# Patient Record
Sex: Female | Born: 1955 | Race: Black or African American | Hispanic: No | Marital: Single | Smoking: Never smoker
Health system: Southern US, Community
[De-identification: ages and names within clinical notes are randomized; demographics above are authoritative.]

## PROBLEM LIST (undated history)

## (undated) DIAGNOSIS — I10 Essential (primary) hypertension: Secondary | ICD-10-CM

---

## 2003-12-13 ENCOUNTER — Ambulatory Visit (HOSPITAL_COMMUNITY): Admission: RE | Admit: 2003-12-13 | Discharge: 2003-12-13 | Payer: Self-pay | Admitting: Anesthesiology

## 2008-08-26 ENCOUNTER — Encounter: Admission: RE | Admit: 2008-08-26 | Discharge: 2008-08-26 | Payer: Self-pay | Admitting: Pulmonary Disease

## 2008-09-14 ENCOUNTER — Encounter: Payer: Self-pay | Admitting: Internal Medicine

## 2010-04-23 ENCOUNTER — Encounter: Payer: Self-pay | Admitting: Internal Medicine

## 2011-03-18 ENCOUNTER — Emergency Department (INDEPENDENT_AMBULATORY_CARE_PROVIDER_SITE_OTHER)
Admission: EM | Admit: 2011-03-18 | Discharge: 2011-03-18 | Disposition: A | Discharge status: Home / Self Care | Payer: Self-pay | Source: Home / Self Care | Attending: Emergency Medicine | Admitting: Emergency Medicine

## 2011-03-18 ENCOUNTER — Encounter: Payer: Self-pay | Admitting: *Deleted

## 2011-03-18 DIAGNOSIS — J329 Chronic sinusitis, unspecified: Secondary | ICD-10-CM

## 2011-03-18 HISTORY — DX: Essential (primary) hypertension: I10

## 2011-03-18 MED ORDER — AMOXICILLIN 500 MG PO CAPS: 500.0000 mg | ORAL_CAPSULE | Freq: Three times a day (TID) | ORAL | Status: AC

## 2011-03-18 NOTE — ED Notes (Signed)
Pt with congestion and cough x one week onset of left earache and left sided facial tenderness

## 2011-03-18 NOTE — ED Provider Notes (Signed)
History     CSN: [id number] Arrival date & time: 03/18/2011 12:53 PM   First MD Initiated Contact with Patient 03/18/11 1314      Chief Complaint  Patient presents with  . Otalgia  . Cough  . Nasal Congestion    (Consider location/radiation/quality/duration/timing/severity/associated sxs/prior treatment) Patient is a 55 y.o. female presenting with ear pain and cough. The history is provided by the patient. No language interpreter was used.  Otalgia This is a new problem. The current episode started more than 2 days ago. There is pain in the left ear. The problem occurs constantly. The problem has been gradually worsening. There has been no fever. The pain is at a severity of 5/10. The pain is moderate. Associated symptoms include rhinorrhea, sore throat and cough. Her past medical history does not include chronic ear infection.  Cough Associated symptoms include ear pain, rhinorrhea and sore throat.    Past Medical History  Diagnosis Date  . Hypertension     History reviewed. No pertinent past surgical history.  History reviewed. No pertinent family history.  History  Substance Use Topics  . Smoking status: Never Smoker   . Smokeless tobacco: Not on file  . Alcohol Use: No    OB History    Grav Para Term Preterm Abortions TAB SAB Ect Mult Living                  Review of Systems  HENT: Positive for ear pain, sore throat and rhinorrhea.   Respiratory: Positive for cough.   All other systems reviewed and are negative.    Allergies  Review of patient's allergies indicates no known allergies.  Home Medications   Current Outpatient Rx  Name Route Sig Dispense Refill  . OLMESARTAN MEDOXOMIL 20 MG PO TABS Oral Take by mouth daily.        BP 132/78  Pulse 94  Temp(Src) 98.2 F (36.8 C) (Oral)  Resp 19  SpO2 100%  Physical Exam  Nursing note and vitals reviewed. Constitutional: She is oriented to person, place, and time. She appears well-developed and  well-nourished.  HENT:  Head: Normocephalic and atraumatic.  Right Ear: External ear normal.  Left Ear: External ear normal.  Nose: Nose normal.  Mouth/Throat: Oropharynx is clear and moist.  Eyes: Conjunctivae and EOM are normal. Pupils are equal, round, and reactive to light.  Neck: Normal range of motion. Neck supple.  Cardiovascular: Normal rate and normal heart sounds.   Pulmonary/Chest: Effort normal.  Abdominal: Soft.  Musculoskeletal: Normal range of motion.  Neurological: She is alert and oriented to person, place, and time.  Skin: Skin is warm.  Psychiatric: She has a normal mood and affect.    ED Course  Procedures (including critical care time)  Labs Reviewed - No data to display No results found.   No diagnosis found.    MDM   I will treat for sinusitis with amoxicillian.     Langston Masker, [place] 03/18/11 1342  Langston Masker, [place] 03/18/11 [phone]

## 2011-03-20 NOTE — ED Provider Notes (Signed)
Medical screening examination/treatment/procedure(s) were performed by non-physician practitioner and as supervising physician I was immediately available for consultation/collaboration.  Luiz Blare MD   Luiz Blare, MD 03/20/11 [phone]

## 2011-10-17 ENCOUNTER — Emergency Department (HOSPITAL_COMMUNITY)
Admission: EM | Admit: 2011-10-17 | Discharge: 2011-10-17 | Disposition: A | Discharge status: Home / Self Care | Payer: Worker's Compensation | Attending: Emergency Medicine | Admitting: Emergency Medicine

## 2011-10-17 ENCOUNTER — Encounter (HOSPITAL_COMMUNITY): Payer: Self-pay | Admitting: *Deleted

## 2011-10-17 DIAGNOSIS — S81009A Unspecified open wound, unspecified knee, initial encounter: Secondary | ICD-10-CM | POA: Insufficient documentation

## 2011-10-17 DIAGNOSIS — W268XXA Contact with other sharp object(s), not elsewhere classified, initial encounter: Secondary | ICD-10-CM | POA: Insufficient documentation

## 2011-10-17 DIAGNOSIS — I10 Essential (primary) hypertension: Secondary | ICD-10-CM | POA: Insufficient documentation

## 2011-10-17 DIAGNOSIS — S81811A Laceration without foreign body, right lower leg, initial encounter: Secondary | ICD-10-CM

## 2011-10-17 DIAGNOSIS — Z23 Encounter for immunization: Secondary | ICD-10-CM | POA: Insufficient documentation

## 2011-10-17 MED ORDER — TETANUS-DIPHTH-ACELL PERTUSSIS 5-2.5-18.5 LF-MCG/0.5 IM SUSP
0.5000 mL | Freq: Once | INTRAMUSCULAR | Status: AC
  Administered 2011-10-17: 0.5 mL via INTRAMUSCULAR
  Filled 2011-10-17: qty 0.5

## 2011-10-17 NOTE — ED Notes (Signed)
Throwing out trash and cut lateral rt leg with something in bag

## 2011-10-17 NOTE — ED Provider Notes (Signed)
History     CSN: [id number]  Arrival date & time 10/17/11  1417   First MD Initiated Contact with Patient 10/17/11 1440      Chief Complaint  Patient presents with  . Laceration    (Consider location/radiation/quality/duration/timing/severity/associated sxs/prior treatment) Patient is a 56 y.o. female presenting with skin laceration. The history is provided by the patient.  Laceration    Patient presents to emergency department complaining of laceration to right lower lateral leg just prior to arrival. Patient states that she was throwing  out the trash and when she swung the trash bag passed her leg something sharp within the trash bag cut her right lower leg. She denies additional injury. Patient is unsure of her last tetanus up date. She was concerned about the initial bleeding stating that she has little to no pain associated with laceration. She did nothing for pain prior to arrival. She is ambulating without difficulty. Denies FB within leg.   Past Medical History  Diagnosis Date  . Hypertension     History reviewed. No pertinent past surgical history.  No family history on file.  History  Substance Use Topics  . Smoking status: Never Smoker   . Smokeless tobacco: Not on file  . Alcohol Use: No    OB History    Grav Para Term Preterm Abortions TAB SAB Ect Mult Living                  Review of Systems  Skin: Positive for wound. Negative for color change.  Neurological: Negative for weakness and numbness.    Allergies  Review of patient's allergies indicates no known allergies.  Home Medications   Current Outpatient Rx  Name Route Sig Dispense Refill  . OLMESARTAN MEDOXOMIL 20 MG PO TABS Oral Take 20 mg by mouth daily.       BP 117/79  Temp 98.4 F (36.9 C)  Resp 16  SpO2 99%  Physical Exam  Constitutional: She is oriented to person, place, and time. She appears well-developed and well-nourished. No distress.  HENT:  Head: Normocephalic and  atraumatic.  Eyes: Conjunctivae are normal.  Cardiovascular: Normal rate and regular rhythm.   Pulmonary/Chest: Effort normal.  Musculoskeletal: She exhibits tenderness.       Right ankle: She exhibits swelling. tenderness.       4 mm linear laceration of right lower lateral leg. Hemastatic. No FB seen or palpated. LE neuro vasc intact with FROM with 5/5 strength.   Neurological: She is alert and oriented to person, place, and time.       Normal sensation of entire foot.   Skin: Skin is warm and dry. No rash noted. She is not diaphoretic. No erythema. No pallor.       4mm linear superficial lac of RLE, lateral.   Psychiatric: She has a normal mood and affect. Her behavior is normal.    ED Course  Procedures (including critical care time)  IM tetanus  Wound cleaned with syringe irrigation with sterile water and betadine.   4mm linear laceration of right lower lateral leg edges approximated well with steri strip.   Labs Reviewed - No data to display No results found.   1. Laceration of right lower extremity       MDM  4mm superficial laceration of RLU without FB seen or palpated. Hemostatic. LE neurovasc intact. Wound care and follow up instructions given. Patient voices understanding.         [place], [place] 10/17/11  1504 

## 2011-10-18 NOTE — ED Provider Notes (Signed)
Medical screening examination/treatment/procedure(s) were performed by non-physician practitioner and as supervising physician I was immediately available for consultation/collaboration.   Brettany Sydney R Daryl Quiros, MD 10/18/11 0749 

## 2013-07-18 ENCOUNTER — Emergency Department (HOSPITAL_COMMUNITY): Payer: 59

## 2013-07-18 ENCOUNTER — Encounter (HOSPITAL_COMMUNITY): Payer: Self-pay | Admitting: Emergency Medicine

## 2013-07-18 ENCOUNTER — Emergency Department (HOSPITAL_COMMUNITY)
Admission: EM | Admit: 2013-07-18 | Discharge: 2013-07-18 | Disposition: A | Discharge status: Home / Self Care | Payer: 59 | Attending: Emergency Medicine | Admitting: Emergency Medicine

## 2013-07-18 DIAGNOSIS — R079 Chest pain, unspecified: Secondary | ICD-10-CM | POA: Insufficient documentation

## 2013-07-18 DIAGNOSIS — I10 Essential (primary) hypertension: Secondary | ICD-10-CM | POA: Insufficient documentation

## 2013-07-18 DIAGNOSIS — Z79899 Other long term (current) drug therapy: Secondary | ICD-10-CM | POA: Insufficient documentation

## 2013-07-18 DIAGNOSIS — J069 Acute upper respiratory infection, unspecified: Secondary | ICD-10-CM

## 2013-07-18 MED ORDER — GUAIFENESIN 100 MG/5ML PO LIQD: 100.0000 mg | ORAL | Status: DC | PRN

## 2013-07-18 MED ORDER — ACETAMINOPHEN 500 MG PO TABS
1000.0000 mg | ORAL_TABLET | Freq: Once | ORAL | Status: AC
  Administered 2013-07-18: 1000 mg via ORAL
  Filled 2013-07-18: qty 2

## 2013-07-18 MED ORDER — IBUPROFEN 600 MG PO TABS: 600.0000 mg | ORAL_TABLET | Freq: Four times a day (QID) | ORAL | Status: DC | PRN

## 2013-07-18 NOTE — ED Notes (Signed)
Brenda FinnerErin Bartlett at bedside

## 2013-07-18 NOTE — Discharge Instructions (Signed)
Take 400-600 mg Ibuprofen (Motrin) every 6-8 hours for fever and pain  Alternate with Tylenol  Take 500 mg Tylenol every 4-6 hours as needed for fever and pain  Follow-up with your primary care provider next week for recheck of symptoms if not improving.  Be sure to drink plenty of fluids and rest, at least 8hrs of sleep a night, preferably more while you are sick. Return to the ED if you cannot keep down fluids/signs of dehydration, fever not reducing with Tylenol, difficulty breathing/wheezing, stiff neck, worsening condition, or other concerns (see below)    Cough, Adult  A cough is a reflex. It helps you clear your throat and airways. A cough can help heal your body. A cough can last 2 or 3 weeks (acute) or may last more than 8 weeks (chronic). Some common causes of a cough can include an infection, allergy, or a cold. HOME CARE  Only take medicine as told by your doctor.  If given, take your medicines (antibiotics) as told. Finish them even if you start to feel better.  Use a cold steam vaporizer or humidier in your home. This can help loosen thick spit (secretions).  Sleep so you are almost sitting up (semi-upright). Use pillows to do this. This helps reduce coughing.  Rest as needed.  Stop smoking if you smoke. GET HELP RIGHT AWAY IF:  You have yellowish-white fluid (pus) in your thick spit.  Your cough gets worse.  Your medicine does not reduce coughing, and you are losing sleep.  You cough up blood.  You have trouble breathing.  Your pain gets worse and medicine does not help.  You have a fever. MAKE SURE YOU:   Understand these instructions.  Will watch your condition.  Will get help right away if you are not doing well or get worse. Document Released: 11/30/2010 Document Revised: 06/11/2011 Document Reviewed: 11/30/2010 [facility] Patient Information 2014 [place], [place].  Cool Mist Vaporizers Vaporizers may help relieve the symptoms of a cough and cold. They  add moisture to the air, which helps mucus to become thinner and less sticky. This makes it easier to breathe and cough up secretions. Cool mist vaporizers do not cause serious burns like hot mist vaporizers ("steamers, humidifiers"). Vaporizers have not been proved to show they help with colds. You should not use a vaporizer if you are allergic to mold.  HOME CARE INSTRUCTIONS  Follow the package instructions for the vaporizer.  Do not use anything other than distilled water in the vaporizer.  Do not run the vaporizer all of the time. This can cause mold or bacteria to grow in the vaporizer.  Clean the vaporizer after each time it is used.  Clean and dry the vaporizer well before storing it.  Stop using the vaporizer if worsening respiratory symptoms develop. Document Released: 12/15/2003 Document Revised: 11/19/2012 Document Reviewed: 08/06/2012 [facility] Patient Information 2014 [place], [place].

## 2013-07-18 NOTE — ED Provider Notes (Signed)
10:50 AM Complains of cough sore throat and headache onset 2 days ago. Denies shortness of breath denies vomiting. On exam no distress nontoxic HEENT exam positive nasal congestion oropharynx is normal lungs clear auscultation. Patient presently feels well and ready to go home. Suspect influenza-like illness  Doug SouSam Nailyn Dearinger, MD 07/18/13 1058

## 2013-07-18 NOTE — ED Provider Notes (Signed)
CSN: [id number]     Arrival date & time 07/18/13  0847 History   First MD Initiated Contact with Patient 07/18/13 [phone]     Chief Complaint  Patient presents with  . Cough  . Sore Throat     (Consider location/radiation/quality/duration/timing/severity/associated sxs/prior Treatment) Patient is a 58 y.o. female presenting with cough and pharyngitis. The history is provided by the patient. No language interpreter was used.  Cough Cough characteristics:  Productive Sputum characteristics:  White Severity:  Moderate Onset quality:  Sudden Duration:  1 day Timing:  Intermittent Progression:  Unchanged Chronicity:  New Smoker: no   Context: upper respiratory infection   Context: not sick contacts   Relieved by:  Nothing Worsened by:  Nothing tried Ineffective treatments:  Cough suppressants Associated symptoms: chest pain, fever and sore throat   Associated symptoms: no chills, no diaphoresis and no shortness of breath   Sore Throat Associated symptoms include chest pain, congestion, coughing, a fever and a sore throat. Pertinent negatives include no abdominal pain, chills, diaphoresis, fatigue, nausea or vomiting.   Pt is a 58yo female with hx of HTN presenting to ED c/o productive cough with white secretions, associated with sore throat and chest pain only with cough, onset yesterday.  Pt also reports subjective fever.  States she took OTC cough medicine w/o relief. Denies abdominal pain, n/v/d. Denies hx of asthma, COPD, or CAD. Denies known sick contacts or recent travel.  Past Medical History  Diagnosis Date  . Hypertension    History reviewed. No pertinent past surgical history. No family history on file. History  Substance Use Topics  . Smoking status: Never Smoker   . Smokeless tobacco: Not on file  . Alcohol Use: No   OB History   Grav Para Term Preterm Abortions TAB SAB Ect Mult Living                 Review of Systems  Constitutional: Positive for fever.  Negative for chills, diaphoresis, appetite change and fatigue.  HENT: Positive for congestion and sore throat. Negative for sneezing, trouble swallowing and voice change.   Respiratory: Positive for cough. Negative for shortness of breath.   Cardiovascular: Positive for chest pain. Negative for palpitations and leg swelling.  Gastrointestinal: Negative for nausea, vomiting, abdominal pain and diarrhea.  All other systems reviewed and are negative.     Allergies  Review of patient's allergies indicates no known allergies.  Home Medications   Prior to Admission medications   Medication Sig Start Date End Date Taking? Authorizing Provider  olmesartan (BENICAR) 20 MG tablet Take 20 mg by mouth daily.     Historical Provider, MD   BP 114/61  Pulse 79  Temp(Src) 100.4 F (38 C) (Oral)  Resp 22  Wt 231 lb 7 oz (104.979 kg)  SpO2 97% Physical Exam  Nursing note and vitals reviewed. Constitutional: She appears well-developed and well-nourished. No distress.  HENT:  Head: Normocephalic and atraumatic.  Right Ear: Hearing, tympanic membrane, external ear and ear canal normal.  Left Ear: Hearing, tympanic membrane, external ear and ear canal normal.  Nose: Mucosal edema present.  Mouth/Throat: Uvula is midline and mucous membranes are normal. Posterior oropharyngeal erythema present. No oropharyngeal exudate, posterior oropharyngeal edema or tonsillar abscesses.  Eyes: Conjunctivae are normal. No scleral icterus.  Neck: Normal range of motion. Neck supple.  Cardiovascular: Normal rate, regular rhythm and normal heart sounds.   Pulmonary/Chest: Effort normal and breath sounds normal. No respiratory distress. She has no  wheezes. She has no rales. She exhibits no tenderness.  No respiratory distress, able to speak in full sentences w/o difficulty. Lungs: CTAB  Abdominal: Soft. Bowel sounds are normal. She exhibits no distension and no mass. There is no tenderness. There is no rebound and no  guarding.  Musculoskeletal: Normal range of motion.  Lymphadenopathy:    She has no cervical adenopathy.  Neurological: She is alert.  Skin: Skin is warm and dry. She is not diaphoretic.    ED Course  Procedures (including critical care time) Labs Review Labs Reviewed - No data to display  Imaging Review Dg Chest 2 View (if Patient Has Fever And/or Copd)  07/18/2013   CLINICAL DATA:  58 year old female with cough and sore throat. Initial encounter.  EXAM: CHEST  2 VIEW  COMPARISON:  08/26/2008.  FINDINGS: Mildly lower lung volumes. Stable cardiac size and mediastinal contours. Visualized tracheal air column is within normal limits. No pneumothorax, pulmonary edema, pleural effusion or confluent pulmonary opacity. Stable increased interstitial markings. No acute osseous abnormality identified.  IMPRESSION: No acute cardiopulmonary abnormality.   Electronically Signed   By: Augusto GambleLee  Hall M.D.   On: 07/18/2013 10:23     EKG Interpretation None      MDM   Final diagnoses:  URI, acute    Pt is a 58yo female c/o productive cough, fever, and chest pain only with cough that started yesterday. Temp in ED 103.1. OTC cough medicine taking at home w/o relief. Pt appears non-toxic, no respiratory distress. Lungs: CTAB. Oropharynx-clear, tonsillar erythema w/o edema or exudate.  Exam otherwise unremarkable.  Not concerned for ACS or PE.  Symptoms likely viral in nature due to sudden onset.  Acetaminophen given in ED for fever.   CXR: no acute cardiopulmonary abnormality.    Recheck Temp-100.4.  Discussed pt with Dr. Ethelda ChickJacubowitz who also examined pt. Pt states she feels comfortable being discharged home.  Advised to f/u with PCP.  Rx: guaifenesin and ibuprofen. Return precautions provided. Pt verbalized understanding and agreement with tx plan.     Junius Finnerrin O'Malley, PA-C 07/18/13 1112

## 2013-07-18 NOTE — ED Notes (Signed)
Pt c/o productive cough with white secretions, sore throat and pain in chest when she coughs onset yesterday. Pt talking in complete sentences without difficulty.

## 2013-07-18 NOTE — ED Provider Notes (Signed)
Medical screening examination/treatment/procedure(s) were conducted as a shared visit with non-physician practitioner(s) and myself.  I personally evaluated the patient during the encounter.   EKG Interpretation None       Doug SouSam Mycah Mcdougall, MD 07/18/13 1721

## 2014-09-21 ENCOUNTER — Other Ambulatory Visit: Payer: Self-pay

## 2014-09-29 ENCOUNTER — Other Ambulatory Visit: Payer: Self-pay

## 2014-09-29 DIAGNOSIS — Z1231 Encounter for screening mammogram for malignant neoplasm of breast: Secondary | ICD-10-CM

## 2014-10-10 IMAGING — CR DG CHEST 2V
2 series · 2 of 2 positions shown · non-contrast
Comparison: 08/26/2008.

CLINICAL DATA: 57-year-old female with cough and sore throat.
Initial encounter.

EXAM:
CHEST  2 VIEW

[w chest pa]
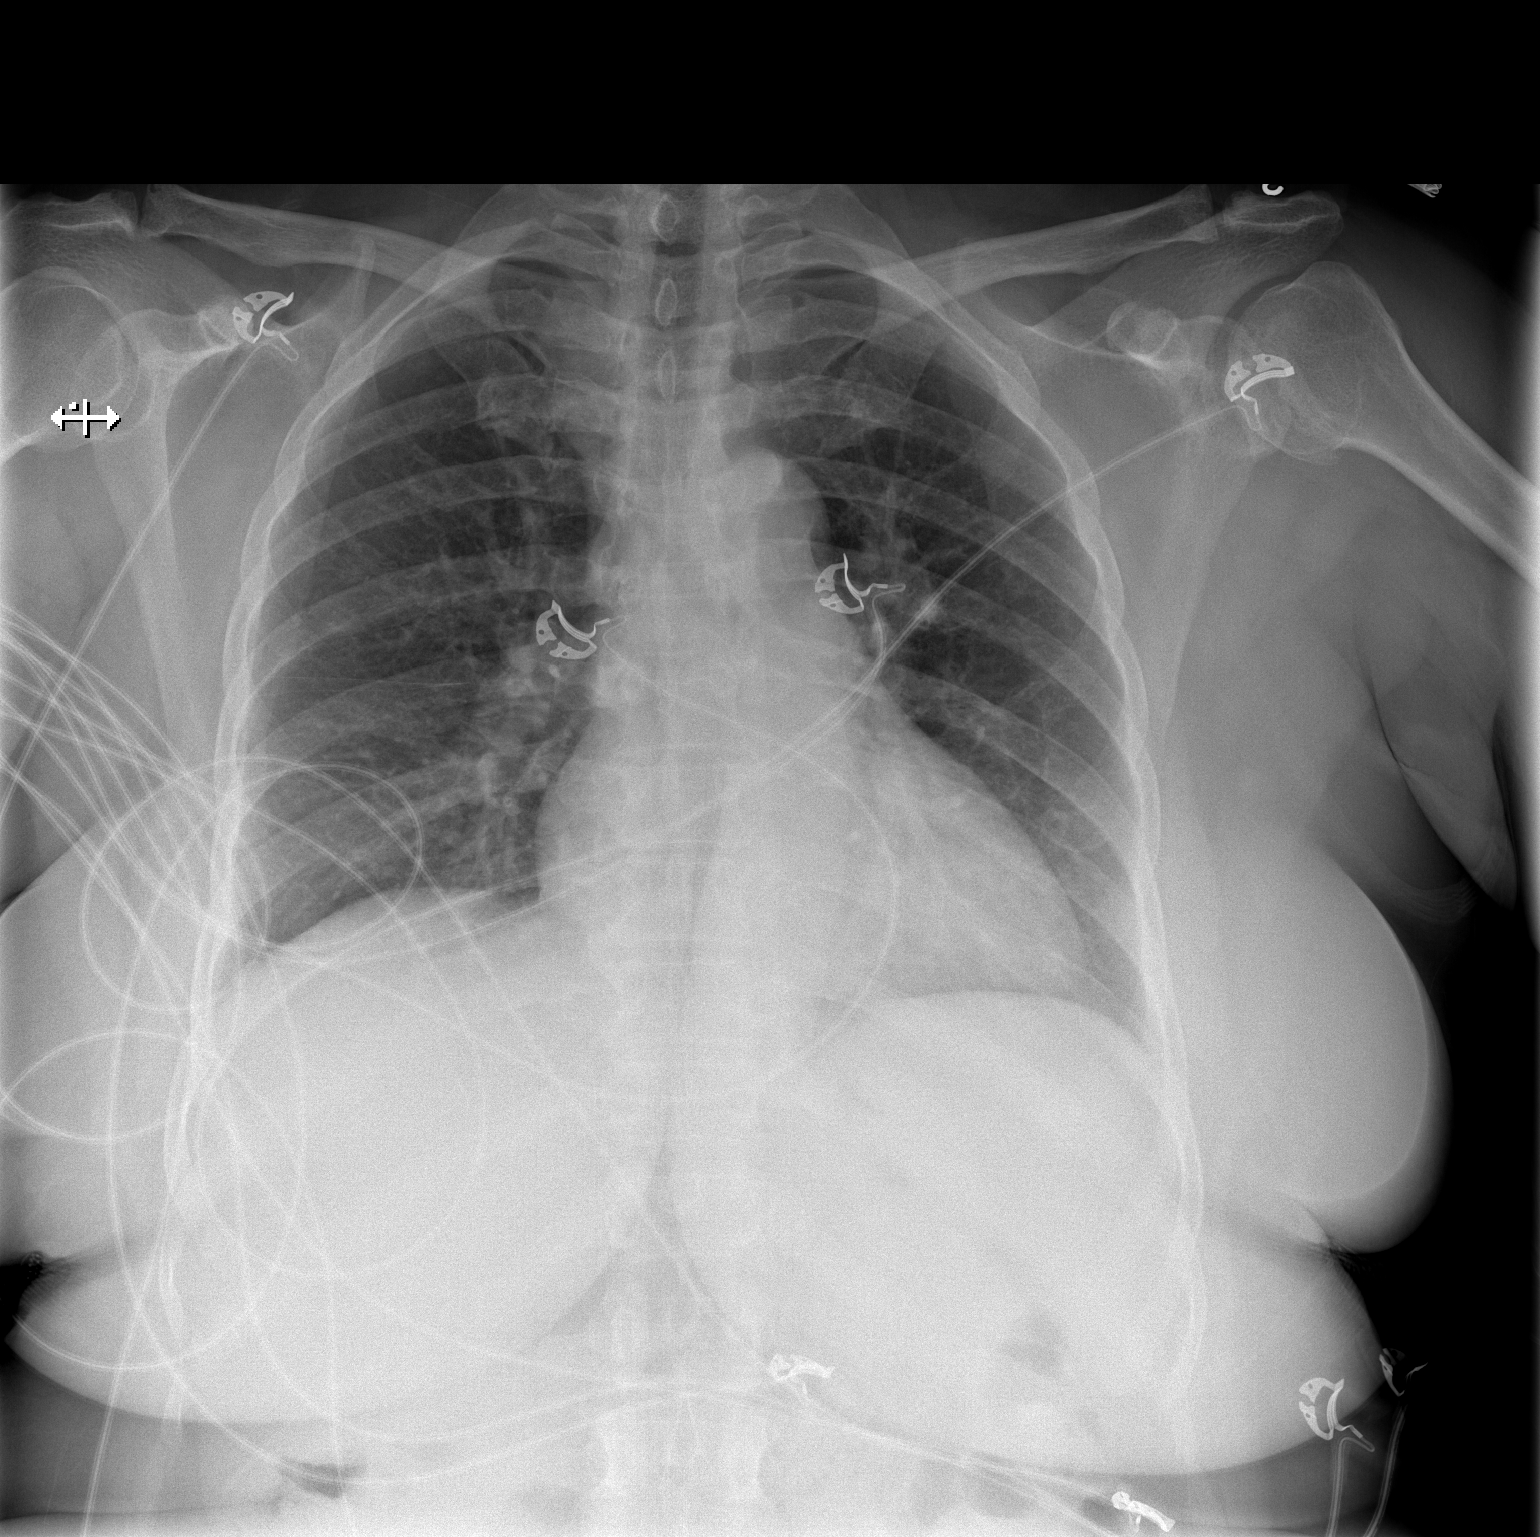

[w chest lat]
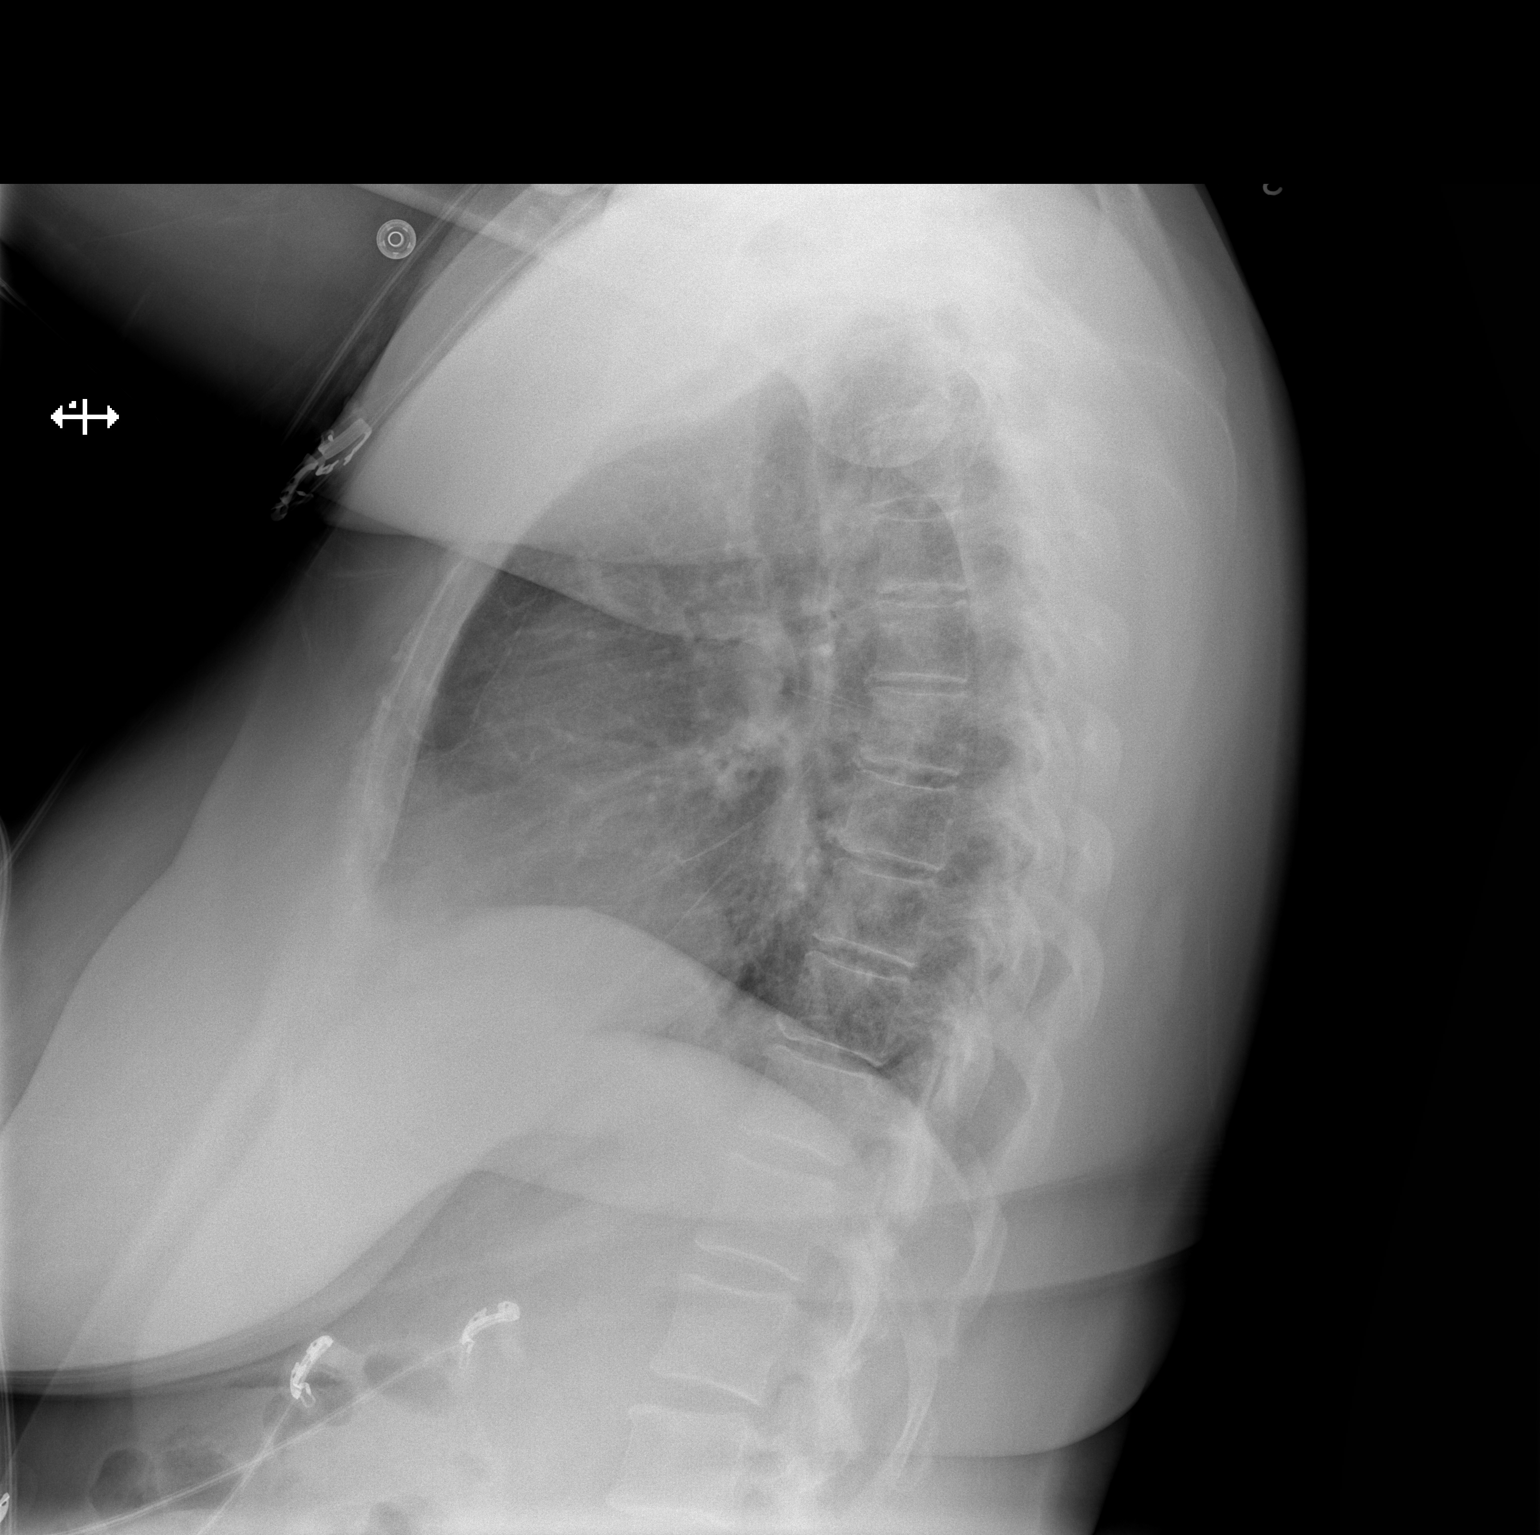

[2 of 2 positions shown; findings below may reference images not displayed]

FINDINGS: Mildly lower lung volumes. Stable cardiac size and mediastinal
contours. Visualized tracheal air column is within normal limits. No
pneumothorax, pulmonary edema, pleural effusion or confluent
pulmonary opacity. Stable increased interstitial markings. No acute
osseous abnormality identified.
IMPRESSION: No acute cardiopulmonary abnormality.

## 2014-10-12 ENCOUNTER — Ambulatory Visit
Admission: RE | Admit: 2014-10-12 | Discharge: 2014-10-12 | Disposition: A | Discharge status: Home / Self Care | Payer: 59 | Source: Ambulatory Visit

## 2014-10-12 DIAGNOSIS — Z1231 Encounter for screening mammogram for malignant neoplasm of breast: Secondary | ICD-10-CM

## 2016-02-14 ENCOUNTER — Other Ambulatory Visit: Payer: Self-pay | Admitting: Internal Medicine

## 2016-02-14 DIAGNOSIS — E2839 Other primary ovarian failure: Secondary | ICD-10-CM

## 2016-10-27 ENCOUNTER — Encounter (HOSPITAL_COMMUNITY): Payer: Self-pay

## 2016-10-27 ENCOUNTER — Emergency Department (HOSPITAL_COMMUNITY)
Admission: EM | Admit: 2016-10-27 | Discharge: 2016-10-27 | Disposition: A | Discharge status: Home / Self Care | Payer: BLUE CROSS/BLUE SHIELD | Attending: Emergency Medicine | Admitting: Emergency Medicine

## 2016-10-27 DIAGNOSIS — Z79899 Other long term (current) drug therapy: Secondary | ICD-10-CM | POA: Diagnosis not present

## 2016-10-27 DIAGNOSIS — R531 Weakness: Secondary | ICD-10-CM | POA: Insufficient documentation

## 2016-10-27 DIAGNOSIS — I10 Essential (primary) hypertension: Secondary | ICD-10-CM | POA: Insufficient documentation

## 2016-10-27 DIAGNOSIS — R5383 Other fatigue: Secondary | ICD-10-CM | POA: Diagnosis not present

## 2016-10-27 LAB — CBC WITH DIFFERENTIAL/PLATELET
Basophils Absolute: 0 10*3/uL (ref 0.0–0.1)
Basophils Relative: 0 %
Eosinophils Absolute: 0.1 10*3/uL (ref 0.0–0.7)
Eosinophils Relative: 2 %
HCT: 33.7 % — ABNORMAL LOW (ref 36.0–46.0)
Hemoglobin: 11.1 g/dL — ABNORMAL LOW (ref 12.0–15.0)
Lymphocytes Relative: 41 %
Lymphs Abs: 2.6 10*3/uL (ref 0.7–4.0)
MCH: 25.9 pg — ABNORMAL LOW (ref 26.0–34.0)
MCHC: 32.9 g/dL (ref 30.0–36.0)
MCV: 78.6 fL (ref 78.0–100.0)
Monocytes Absolute: 0.7 10*3/uL (ref 0.1–1.0)
Monocytes Relative: 11 %
Neutro Abs: 2.9 10*3/uL (ref 1.7–7.7)
Neutrophils Relative %: 46 %
Platelets: 292 10*3/uL (ref 150–400)
RBC: 4.29 MIL/uL (ref 3.87–5.11)
RDW: 13.3 % (ref 11.5–15.5)
WBC: 6.3 10*3/uL (ref 4.0–10.5)

## 2016-10-27 LAB — URINALYSIS, ROUTINE W REFLEX MICROSCOPIC
Bilirubin Urine: NEGATIVE
Glucose, UA: NEGATIVE mg/dL
Hgb urine dipstick: NEGATIVE
Ketones, ur: NEGATIVE mg/dL
Leukocytes, UA: NEGATIVE
Nitrite: NEGATIVE
Protein, ur: NEGATIVE mg/dL
Specific Gravity, Urine: 1.008 (ref 1.005–1.030)
pH: 5 (ref 5.0–8.0)

## 2016-10-27 LAB — BASIC METABOLIC PANEL
Anion gap: 8 (ref 5–15)
BUN: 16 mg/dL (ref 6–20)
CO2: 25 mmol/L (ref 22–32)
Calcium: 8.7 mg/dL — ABNORMAL LOW (ref 8.9–10.3)
Chloride: 99 mmol/L — ABNORMAL LOW (ref 101–111)
Creatinine, Ser: 0.96 mg/dL (ref 0.44–1.00)
GFR calc Af Amer: >60 mL/min (ref >60)
GFR calc non Af Amer: >60 mL/min (ref >60)
Glucose, Bld: 114 mg/dL — ABNORMAL HIGH (ref 65–99)
Potassium: 4.1 mmol/L (ref 3.5–5.1)
Sodium: 132 mmol/L — ABNORMAL LOW (ref 135–145)

## 2016-10-27 MED ORDER — SODIUM CHLORIDE 0.9 % IV BOLUS (SEPSIS)
1000.0000 mL | Freq: Once | INTRAVENOUS | Status: AC
  Administered 2016-10-27: 1000 mL via INTRAVENOUS

## 2016-10-27 NOTE — ED Provider Notes (Signed)
[facility]-EMERGENCY DEPT Provider Note   CSN: [id number] Arrival date & time: 10/27/16  1345  By signing my name below, I, Brenda Bartlett, attest that this documentation has been prepared under the direction and in the presence of Raeford RazorKohut, Lambert Jeanty, MD. Electronically signed, Brenda Bartlett, ED Scribe. 10/27/16. 2:42 PM.  History   Chief Complaint Chief Complaint  Patient presents with  . Dizziness  . Fatigue    HPI HPI Comments: Brenda Bartlett is a 61 y.o. female with Hx of HTN, who presents to the Emergency Department complaining of generalized weakness and fatigue that started today while she was at work. Pt was feeling well prior to going to work. 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 Pt gradually started to feel weak "all over". She further reports intermittent lower back pain today while she was at work which she is still currently having. Pt does not frequently have back pain. Pt also reports joint pain in her bilateral elbows and wrists. Pt states that she is feeling very tired at the moment. No headache. She denies any dizziness or lightheadedness. No nausea, vomiting.  The history is provided by the patient. No language interpreter was used.    Past Medical History:  Diagnosis Date  . Hypertension     There are no active problems to display for this patient.   History reviewed. No pertinent surgical history.  OB History    No data available       Home Medications    Prior to Admission medications   Medication Sig Start Date End Date Taking? Authorizing Provider  guaiFENesin (ROBITUSSIN) 100 MG/5ML liquid Take 5-10 mLs (100-200 mg total) by mouth every 4 (four) hours as needed for cough. 07/18/13   Lurene ShadowPhelps, Erin O, PA-C  ibuprofen (ADVIL,MOTRIN) 600 MG tablet Take 1 tablet (600 mg total) by mouth every 6 (six) hours as needed. 07/18/13   Lurene ShadowPhelps, Erin O, PA-C  olmesartan (BENICAR) 20 MG tablet Take 20 mg by mouth daily.     [provider]      Family History No family history on file.  Social History Social History  Substance Use Topics  . Smoking status: Never Smoker  . Smokeless tobacco: Never Used  . Alcohol use No     Allergies   Patient has no known allergies.   Review of Systems Review of Systems A complete 10 system review of systems was obtained and all systems are negative except as noted in the HPI and PMH.    Physical Exam Updated Vital Signs BP 123/72 (BP Location: Left Arm)   Pulse (!) 109   Temp 98.1 F (36.7 C) (Oral)   Resp 20   SpO2 99%   Physical Exam  Constitutional: She is oriented to person, place, and time. She appears well-developed and well-nourished. No distress.  HENT:  Head: Normocephalic and atraumatic.  Eyes: EOM are normal.  Neck: Normal range of motion.  Cardiovascular: Normal rate, regular rhythm and normal heart sounds.   Pulmonary/Chest: Effort normal and breath sounds normal.  Abdominal: Soft. She exhibits no distension. There is no tenderness.  Musculoskeletal: Normal range of motion.  Neurological: She is alert and oriented to person, place, and time.  Skin: Skin is warm and dry.  Psychiatric: She has a normal mood and affect. Judgment normal.  Nursing note and vitals reviewed.    ED Treatments / Results  DIAGNOSTIC STUDIES: Oxygen Saturation is 99% on RA, normal by my interpretation.  COORDINATION  OF CARE: 2:38 PM-Discussed treatment plan with pt at bedside and pt agreed to plan.   Labs (all labs ordered are listed, but only abnormal results are displayed) Labs Reviewed  CBC WITH DIFFERENTIAL/PLATELET - Abnormal; Notable for the following:       Result Value   Hemoglobin 11.1 (*)    HCT 33.7 (*)    MCH 25.9 (*)    All other components within normal limits  BASIC METABOLIC PANEL - Abnormal; Notable for the following:    Sodium 132 (*)    Chloride 99 (*)    Glucose, Bld 114 (*)    Calcium 8.7 (*)    All other components within normal limits   URINALYSIS, ROUTINE W REFLEX MICROSCOPIC - Abnormal; Notable for the following:    Color, Urine STRAW (*)    All other components within normal limits    EKG  EKG Interpretation  Date/Time:  Saturday October 27 2016 15:08:58 EDT Ventricular Rate:  92 PR Interval:    QRS Duration: 99 QT Interval:  357 QTC Calculation: 442 R Axis:   -6 Text Interpretation:  Sinus rhythm Probable left atrial enlargement Low voltage, precordial leads RSR' in V1 or V2, probably normal variant Borderline T abnormalities, anterior leads Confirmed by Raeford RazorKohut, Jaevon Paras [phone]([id number]) on 10/27/2016 4:07:36 PM       Radiology No results found.  Procedures Procedures (including critical care time)  Medications Ordered in ED Medications - No data to display   Initial Impression / Assessment and Plan / ED Course  I have reviewed the triage vital signs and the nursing notes.  Pertinent labs & imaging results that were available during my care of the patient were reviewed by me and considered in my medical decision making (see chart for details).     60yF with generalized weakness. W/u unremarkable. HD stable. Exam nonfocal.   Final Clinical Impressions(s) / ED Diagnoses   Final diagnoses:  Weakness    New Prescriptions New Prescriptions   No medications on file    I personally preformed the services scribed in my presence. The recorded information has been reviewed is accurate. Raeford RazorStephen Greogry Goodwyn, MD.     Raeford RazorKohut, Ellianne Gowen, MD 11/04/16 [id number]

## 2016-10-27 NOTE — ED Triage Notes (Signed)
Patient presented with complain of dizziness started today at work. Pt state she also feel weak and back and shoulder pain but denies chest pain at this time.

## 2016-10-27 NOTE — ED Notes (Signed)
Pt ambulated to the restroom without assist. No complaints of dizziness.

## 2016-12-13 ENCOUNTER — Encounter: Payer: Self-pay | Admitting: Internal Medicine

## 2017-11-22 ENCOUNTER — Other Ambulatory Visit: Payer: Self-pay | Admitting: Internal Medicine

## 2017-11-22 DIAGNOSIS — E2839 Other primary ovarian failure: Secondary | ICD-10-CM

## 2019-09-16 ENCOUNTER — Encounter (HOSPITAL_COMMUNITY): Payer: Self-pay

## 2019-09-16 ENCOUNTER — Other Ambulatory Visit: Payer: Self-pay

## 2019-09-16 ENCOUNTER — Ambulatory Visit (HOSPITAL_COMMUNITY)
Admission: EM | Admit: 2019-09-16 | Discharge: 2019-09-16 | Disposition: A | Discharge status: Home / Self Care | Payer: BLUE CROSS/BLUE SHIELD | Attending: Emergency Medicine | Admitting: Emergency Medicine

## 2019-09-16 DIAGNOSIS — S39012A Strain of muscle, fascia and tendon of lower back, initial encounter: Secondary | ICD-10-CM

## 2019-09-16 MED ORDER — TIZANIDINE HCL 4 MG PO TABS: 4.0000 mg | ORAL_TABLET | Freq: Three times a day (TID) | ORAL | 0 refills | Status: AC | PRN

## 2019-09-16 MED ORDER — IBUPROFEN 600 MG PO TABS: 600.0000 mg | ORAL_TABLET | Freq: Four times a day (QID) | ORAL | 0 refills | Status: AC | PRN

## 2019-09-16 NOTE — ED Triage Notes (Addendum)
Pt c/o chronic/recurrent bilateral shoulder pain, knee pain for several weeks. States she was evaluated by her doctor for the same and given medications. Also c/o right lumbar back pain for several days and states it increases with physical activity/bending over. Denies CP, abdom pain, dysuria sx, n/v/d or other c/os.  In verifying medications with pt, she brought two bottles of losartaan/HCTZ of same strength from two different pharmacies and pt stated she was instructed to take pills "from both bottles each day" and to take her amlodipine which pt also brought with her. In clarifying, pt has been taking double the dose of losartaan/hctz from what is prescribed/instructed on Rx.

## 2019-09-16 NOTE — ED Provider Notes (Signed)
HPI  SUBJECTIVE:  Brenda Bartlett is a 64 y.o. female who presents with right-sided back pain for the past week after doing some heavy lifting.  She also reports bilateral shoulder pain, knee pain for a "long time".  States that this is not any different today.  No joint erythema, edema, increased temperature.  No fevers, back trauma.  No extremity numbness, weakness or tingling.  No urinary or fecal incontinence,  Saddle anesthesia.  She has had identical back pain before but is not sure what the cause was.  She has no urinary complaints.  She has tried 400 mg ibuprofen in the morning and tried Tylenol once.  States that nothing helps.  Symptoms are worse with walking, torso rotation.  Past medical history of arthritis, hypertension.  No history of chronic kidney disease, osteoporosis, diabetes.  PMD: Nolene Ebbs, MD    Past Medical History:  Diagnosis Date  . Hypertension     History reviewed. No pertinent surgical history.  Family History  Family history unknown: Yes    Social History   Tobacco Use  . Smoking status: Never Smoker  . Smokeless tobacco: Never Used  Substance Use Topics  . Alcohol use: No  . Drug use: No    No current facility-administered medications for this encounter.  Current Outpatient Medications:  .  amLODipine (NORVASC) 5 MG tablet, Take 1 tablet by mouth daily., Disp: , Rfl: 5 .  atorvastatin (LIPITOR) 40 MG tablet, SMARTSIG:1 Tablet(s) By Mouth Every Evening, Disp: , Rfl:  .  losartan-hydrochlorothiazide (HYZAAR) 100-12.5 MG tablet, Take 1 tablet by mouth daily., Disp: , Rfl: 2 .  dorzolamide (TRUSOPT) 2 % ophthalmic solution, Place 1 drop into both eyes daily., Disp: , Rfl: 4 .  ibuprofen (ADVIL) 600 MG tablet, Take 1 tablet (600 mg total) by mouth every 6 (six) hours as needed., Disp: 30 tablet, Rfl: 0 .  tiZANidine (ZANAFLEX) 4 MG tablet, Take 1 tablet (4 mg total) by mouth every 8 (eight) hours as needed for muscle spasms., Disp: 30 tablet, Rfl:  0  No Known Allergies   ROS  As noted in HPI.   Physical Exam  BP (!) 155/73 (BP Location: Left Arm)   Pulse 74   Temp 98.3 F (36.8 C) (Oral)   Resp 18   SpO2 100%   Constitutional: Well developed, well nourished, no acute distress Eyes:  EOMI, conjunctiva normal bilaterally HENT: Normocephalic, atraumatic,mucus membranes moist Respiratory: Normal inspiratory effort Cardiovascular: Normal rate GI: nondistended. No suprapubic tenderness skin: No rash, skin intact Musculoskeletal: no CVAT. + R paralumbar tenderness, - muscle spasm. No bony tenderness. Bilateral lower extremities nontender, baseline ROM with intact DP  Pulses. No pain with int/ext rotation extension hips bilaterally.  Pain aggravated with right hip flexion against resistance.  SLR neg bilaterally. Sensation baseline light touch bilaterally for Pt, DTR's symmetric and intact bilaterally KJ, Motor symmetric bilateral 5/5 hip flexion, quadriceps, hamstrings, EHL, foot dorsiflexion, foot plantarflexion, gait somewhat antalgic but without apparent new ataxia. Neurologic: Alert & oriented x 3, no focal neuro deficits Psychiatric: Speech and behavior appropriate   ED Course   Medications - No data to display  No orders of the defined types were placed in this encounter.   No results found for this or any previous visit (from the past 24 hour(s)). No results found.  ED Clinical Impression  1. Strain of lumbar region, initial encounter     ED Assessment/Plan   No evidence of uti, nephrolithiasis. No evidence of spinal  cord involvement based on H&P. Pt describing typical back pain, has been < 6 week duration. No historical red flags as noted in HPI. No physical red flags such as fever, bony tenderness, lower extremity weakness, saddle anesthesia. Imaging not indicated at this time.   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  She is moving all extremities denies any trauma, and is able to bear weight on  her left lower extremity.  States that her pain has not changed from her baseline.  Tylenol/ibuprofen.  Patient has not been taking a whole lot of ibuprofen.  Will increase it to 600 mg 3-4 times a day combined with 1000 mg Tylenol.  Zanaflex.  Follow-up with her PMD in a week, to the ER if she gets worse.  Discussed MDM, treatment plan, and plan for follow-up with patient. Discussed sn/sx that should prompt return to the ED. patient agrees with plan.   Meds ordered this encounter  Medications  . ibuprofen (ADVIL) 600 MG tablet    Sig: Take 1 tablet (600 mg total) by mouth every 6 (six) hours as needed.    Dispense:  30 tablet    Refill:  0  . tiZANidine (ZANAFLEX) 4 MG tablet    Sig: Take 1 tablet (4 mg total) by mouth every 8 (eight) hours as needed for muscle spasms.    Dispense:  30 tablet    Refill:  0    *This clinic note was created using Scientist, clinical (histocompatibility and immunogenetics). Therefore, there may be occasional mistakes despite careful proofreading.  ?    Domenick Gong, MD 09/16/19 [phone]

## 2019-09-16 NOTE — Discharge Instructions (Addendum)
Take 600 mg of ibuprofen combined with 1000 mg, that is 2 extra strength, Tylenol 3 or 4 times a day as needed for pain.  The Zanaflex as a muscle relaxant will also help with the pain.  Heat or ice, whichever feels better.  Follow-up with your primary care physician to talk about your medications to clarify what exactly you are supposed to be taking.

## 2019-09-16 NOTE — ED Notes (Signed)
Asked pt if she has another language rather than English that she would prefer to use for communication but pt declined and stated English "is fine".

## 2022-06-19 ENCOUNTER — Other Ambulatory Visit: Payer: Self-pay | Admitting: Internal Medicine

## 2022-06-20 LAB — COMPLETE METABOLIC PANEL WITH GFR
AG Ratio: 1.2 (calc) (ref 1.0–2.5)
ALT: 24 U/L (ref 6–29)
AST: 27 U/L (ref 10–35)
Albumin: 4.6 g/dL (ref 3.6–5.1)
Alkaline phosphatase (APISO): 119 U/L (ref 37–153)
BUN: 18 mg/dL (ref 7–25)
CO2: 21 mmol/L (ref 20–32)
Calcium: 9.1 mg/dL (ref 8.6–10.4)
Chloride: 100 mmol/L (ref 98–110)
Creat: 0.77 mg/dL (ref 0.50–1.05)
Globulin: 4 g/dL (calc) — ABNORMAL HIGH (ref 1.9–3.7)
Glucose, Bld: 93 mg/dL (ref 65–99)
Potassium: 4.2 mmol/L (ref 3.5–5.3)
Sodium: 136 mmol/L (ref 135–146)
Total Bilirubin: 0.6 mg/dL (ref 0.2–1.2)
Total Protein: 8.6 g/dL — ABNORMAL HIGH (ref 6.1–8.1)
eGFR: 84 mL/min/{1.73_m2} (ref >=60)

## 2022-06-20 LAB — TSH: TSH: 1.56 mIU/L (ref 0.40–4.50)

## 2022-06-20 LAB — LIPID PANEL
Cholesterol: 225 mg/dL — ABNORMAL HIGH (ref <200)
HDL: 120 mg/dL (ref >=50)
LDL Cholesterol (Calc): 86 mg/dL (calc)
Non-HDL Cholesterol (Calc): 105 mg/dL (calc) (ref <130)
Total CHOL/HDL Ratio: 1.9 (calc) (ref <5.0)
Triglycerides: 96 mg/dL (ref <150)

## 2022-06-20 LAB — CBC
HCT: 39.5 % (ref 35.0–45.0)
Hemoglobin: 12.6 g/dL (ref 11.7–15.5)
MCH: 25.8 pg — ABNORMAL LOW (ref 27.0–33.0)
MCHC: 31.9 g/dL — ABNORMAL LOW (ref 32.0–36.0)
MCV: 80.9 fL (ref 80.0–100.0)
MPV: 11 fL (ref 7.5–12.5)
Platelets: 317 10*3/uL (ref 140–400)
RBC: 4.88 10*6/uL (ref 3.80–5.10)
RDW: 13.1 % (ref 11.0–15.0)
WBC: 7.5 10*3/uL (ref 3.8–10.8)

## 2022-06-20 LAB — HEMOGLOBIN A1C W/OUT EAG: Hgb A1c MFr Bld: 6.4 % of total Hgb — ABNORMAL HIGH (ref <5.7)

## 2022-06-20 LAB — VITAMIN D 25 HYDROXY (VIT D DEFICIENCY, FRACTURES): Vit D, 25-Hydroxy: 19 ng/mL — ABNORMAL LOW (ref 30–100)
